# Patient Record
Sex: Female | Born: 1995 | Race: Black or African American | Hispanic: No | Marital: Single | Smoking: Never smoker
Health system: Southern US, Community
[De-identification: ages and names within clinical notes are randomized; demographics above are authoritative.]

## PROBLEM LIST (undated history)

## (undated) DIAGNOSIS — O98819 Other maternal infectious and parasitic diseases complicating pregnancy, unspecified trimester: Secondary | ICD-10-CM

## (undated) DIAGNOSIS — A749 Chlamydial infection, unspecified: Secondary | ICD-10-CM

## (undated) DIAGNOSIS — E119 Type 2 diabetes mellitus without complications: Secondary | ICD-10-CM

---
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
## 2017-08-30 HISTORY — PX: INCISION AND DRAINAGE: SHX5863

## 2017-10-30 ENCOUNTER — Other Ambulatory Visit: Payer: Self-pay

## 2017-10-30 ENCOUNTER — Emergency Department (HOSPITAL_COMMUNITY)
Admission: EM | Admit: 2017-10-30 | Discharge: 2017-10-30 | Disposition: A | Discharge status: Home / Self Care | Payer: PRIVATE HEALTH INSURANCE | Attending: Emergency Medicine | Admitting: Emergency Medicine

## 2017-10-30 ENCOUNTER — Encounter (HOSPITAL_COMMUNITY): Payer: Self-pay

## 2017-10-30 DIAGNOSIS — Z794 Long term (current) use of insulin: Secondary | ICD-10-CM | POA: Insufficient documentation

## 2017-10-30 DIAGNOSIS — L0231 Cutaneous abscess of buttock: Secondary | ICD-10-CM | POA: Insufficient documentation

## 2017-10-30 DIAGNOSIS — E109 Type 1 diabetes mellitus without complications: Secondary | ICD-10-CM | POA: Insufficient documentation

## 2017-10-30 DIAGNOSIS — Z23 Encounter for immunization: Secondary | ICD-10-CM | POA: Diagnosis not present

## 2017-10-30 HISTORY — DX: Type 2 diabetes mellitus without complications: E11.9

## 2017-10-30 LAB — CBG MONITORING, ED: GLUCOSE-CAPILLARY: 328 mg/dL — AB (ref 65–99)

## 2017-10-30 MED ORDER — LIDOCAINE-EPINEPHRINE (PF) 2 %-1:200000 IJ SOLN
10.0000 mL | Freq: Once | INTRAMUSCULAR | Status: AC
  Administered 2017-10-30: 10 mL
  Filled 2017-10-30: qty 20

## 2017-10-30 MED ORDER — TETANUS-DIPHTH-ACELL PERTUSSIS 5-2.5-18.5 LF-MCG/0.5 IM SUSP
0.5000 mL | Freq: Once | INTRAMUSCULAR | Status: AC
  Administered 2017-10-30: 0.5 mL via INTRAMUSCULAR
  Filled 2017-10-30: qty 0.5

## 2017-10-30 MED ORDER — LIDOCAINE-EPINEPHRINE-TETRACAINE (LET) SOLUTION
3.0000 mL | Freq: Once | NASAL | Status: AC
  Administered 2017-10-30: 17:00:00 3 mL via TOPICAL
  Filled 2017-10-30: qty 3

## 2017-10-30 NOTE — ED Triage Notes (Signed)
Pt states she has an abscess on her abscess. She has hx of diabetes and hx of ulcer. Pt states BS has been controlled at home.

## 2017-10-30 NOTE — ED Provider Notes (Signed)
[facility] Provider Note   CSN: [id number] Arrival date & time: 10/30/17  1239     History   Chief Complaint Chief Complaint  Patient presents with  . Abscess    HPI Pamela Horton is a 21 y.o. female with past medical history significant for type 1 diabetes insulin-dependent presenting with right buttocks abscess which has worsened over the past 2 days. Patient has history of same in the past with abscess to buttocks.  She has not tried anything for her symptoms.  Denies any fever, chills, nausea, vomiting.  She reports some mild cold symptoms starting the last few days.  She denies any drainage.  HPI  Past Medical History:  Diagnosis Date  . Diabetes mellitus without complication (HCC)     There are no active problems to display for this patient.     OB History    No data available       Home Medications    Prior to Admission medications   Not on File    Family History No family history on file.  Social History Social History   Tobacco Use  . Smoking status: Never Smoker  . Smokeless tobacco: Never Used  Substance Use Topics  . Alcohol use: No    Frequency: Never  . Drug use: No     Allergies   Patient has no known allergies.   Review of Systems Review of Systems  Constitutional: Negative for chills and fever.  HENT: Positive for sneezing. Negative for congestion, ear pain and sore throat.   Eyes: Negative for pain and visual disturbance.  Respiratory: Positive for cough. Negative for chest tightness, shortness of breath, wheezing and stridor.   Cardiovascular: Negative for chest pain and palpitations.  Gastrointestinal: Positive for nausea. Negative for abdominal distention, abdominal pain, blood in stool, diarrhea and vomiting.  Genitourinary: Negative for difficulty urinating, dysuria and hematuria.  Musculoskeletal: Positive for myalgias. Negative for arthralgias, back pain, neck pain and neck  stiffness.  Skin: Positive for color change. Negative for pallor and rash.  Neurological: Negative for dizziness, seizures, syncope and light-headedness.     Physical Exam Updated Vital Signs BP 110/71   Pulse 99   Temp 98.3 F (36.8 C) (Oral)   Resp 16   LMP 09/29/2017 (Within Days)   SpO2 95%   Physical Exam  Constitutional: She appears well-developed and well-nourished. No distress.  Afebrile, nontoxic appearing, lying comfortably in bed in no acute distress.  HENT:  Head: Normocephalic and atraumatic.  Eyes: Conjunctivae and EOM are normal.  Neck: Normal range of motion.  Cardiovascular: Normal rate, regular rhythm and normal heart sounds.  No murmur heard. Pulmonary/Chest: Effort normal and breath sounds normal. No stridor. No respiratory distress. She has no wheezes. She has no rales.  Abdominal: She exhibits no distension.  Musculoskeletal: Normal range of motion. She exhibits no edema.  Neurological: She is alert.  Skin: Skin is warm and dry. She is not diaphoretic. There is erythema.  1.5 cm area of induration and erythema with central fluctuance to the right buttocks approximately 2 cm from anus  Psychiatric: She has a normal mood and affect.  Nursing note and vitals reviewed.    ED Treatments / Results  Labs (all labs ordered are listed, but only abnormal results are displayed) Labs Reviewed  CBG MONITORING, ED - Abnormal; Notable for the following components:      Result Value   Glucose-Capillary 328 (*)    All other components  Lorin [phone]13.2[facility] [phone] ADTEXTTAG>ry Center LLCKoreaKoreaMadelaine [phone] 8mKentuckyLorin [phone]13.2[facility] HealdtonKoreaKoreaMadelaine [phone] 53mKentuckyLorin [phone]13.2[facility] OcalaKoreaKoreaMadelaine [phone] 78mKentuckyLorin [phone]13.2[facility] CenterKoreaKoreaMadelaine BhatGeorgetown

## 2017-10-30 NOTE — Discharge Instructions (Signed)
As discussed, keep the area clean and dry and I have the wound rechecked in 48 hours.   Packing will likely be removed and you may continue with warm soaks and compress thereafter. Take ibuprofen or tylenol as needed for pain.  Return if symptoms worsen, worsening pain, fever, chills, nausea, vomiting or other new concerning symptoms in the meantime.

## 2017-11-02 ENCOUNTER — Ambulatory Visit (HOSPITAL_COMMUNITY)
Admission: EM | Admit: 2017-11-02 | Discharge: 2017-11-02 | Disposition: A | Discharge status: Home / Self Care | Payer: PRIVATE HEALTH INSURANCE | Attending: Internal Medicine | Admitting: Internal Medicine

## 2017-11-02 ENCOUNTER — Encounter (HOSPITAL_COMMUNITY): Payer: Self-pay | Admitting: Emergency Medicine

## 2017-11-02 ENCOUNTER — Other Ambulatory Visit: Payer: Self-pay

## 2017-11-02 DIAGNOSIS — Z4801 Encounter for change or removal of surgical wound dressing: Secondary | ICD-10-CM | POA: Diagnosis not present

## 2017-11-02 DIAGNOSIS — L0231 Cutaneous abscess of buttock: Secondary | ICD-10-CM

## 2017-11-02 DIAGNOSIS — Z5189 Encounter for other specified aftercare: Secondary | ICD-10-CM

## 2017-11-02 MED ORDER — SULFAMETHOXAZOLE-TRIMETHOPRIM 800-160 MG PO TABS: 1.0000 | ORAL_TABLET | Freq: Two times a day (BID) | ORAL | 0 refills | Status: DC

## 2017-11-02 NOTE — ED Provider Notes (Signed)
[facility]    CSN: [id number] Arrival date & time: 11/02/17  1643     History   Chief Complaint Chief Complaint  Patient presents with  . Wound Check    HPI Pamela Horton is a 21 y.o. female presents to the clinic for evaluation of wound check of the right buttocks.  Patient presented to the ED 10/30/2017 for incision and drainage of right buttocks abscess.  She has had continued drainage and pain.  Swelling has improved.  She denies any fevers.  She is not taking any medications for pain.  Pain is moderate. HPI  Past Medical History:  Diagnosis Date  . Diabetes mellitus without complication (HCC)     There are no active problems to display for this patient.   History reviewed. No pertinent surgical history.  OB History    No data available       Home Medications    Prior to Admission medications   Medication Sig Start Date End Date Taking? Authorizing Provider  sulfamethoxazole-trimethoprim (BACTRIM DS,SEPTRA DS) 800-160 MG tablet Take 1 tablet by mouth 2 (two) times daily. 11/02/17   Evon SlackGaines, Thomas C, PA-C    Family History No family history on file.  Social History Social History   Tobacco Use  . Smoking status: Never Smoker  . Smokeless tobacco: Never Used  Substance Use Topics  . Alcohol use: No    Frequency: Never  . Drug use: No     Allergies   Patient has no known allergies.   Review of Systems Review of Systems  Constitutional: Negative for fever.  Respiratory: Negative for shortness of breath.   Cardiovascular: Negative for chest pain.  Gastrointestinal: Negative for abdominal pain.  Genitourinary: Negative for difficulty urinating, dysuria and urgency.  Musculoskeletal: Negative for back pain and myalgias.  Skin: Positive for wound. Negative for rash.  Neurological: Negative for dizziness and headaches.     Physical Exam Triage Vital Signs ED Triage Vitals  Enc Vitals Group     BP 11/02/17 1719 129/81     Pulse Rate  11/02/17 1719 (!) 125     Resp 11/02/17 1719 18     Temp 11/02/17 1719 98.2 F (36.8 C)     Temp src --      SpO2 11/02/17 1719 98 %     Weight --      Height --      Head Circumference --      Peak Flow --      Pain Score 11/02/17 1720 2     Pain Loc --      Pain Edu? --      Excl. in GC? --    No data found.  Updated Vital Signs BP 129/81   Pulse (!) 125   Temp 98.2 F (36.8 C)   Resp 18   SpO2 98%   Visual Acuity Right Eye Distance:   Left Eye Distance:   Bilateral Distance:    Right Eye Near:   Left Eye Near:    Bilateral Near:     Physical Exam  Constitutional: She is oriented to person, place, and time. She appears well-developed and well-nourished. No distress.  HENT:  Head: Normocephalic and atraumatic.  Mouth/Throat: Oropharynx is clear and moist.  Eyes: Conjunctivae are normal. Right eye exhibits no discharge. Left eye exhibits no discharge.  Neck: Normal range of motion.  Cardiovascular: Normal rate.  Pulmonary/Chest: No respiratory distress.  Musculoskeletal: Normal range of motion. She exhibits no  deformity.  Neurological: She is alert and oriented to person, place, and time. She has normal reflexes.  Skin: Skin is warm and dry.  Examination of the right buttock shows a 0.5 cm in diameter opening along a mildly indurated area that is approximately 1 cm in diameter.  There is mild surrounding erythema that measures 1 cm in diameter.  With compression of the wound there is mild purulent drainage.  There is no fluctuance.  Packing is removed and a smaller piece of packing is placed into the wound.  Patient tolerated the procedure well.  Band-Aid is applied.  Psychiatric: She has a normal mood and affect. Her behavior is normal. Thought content normal.     UC Treatments / Results  Labs (all labs ordered are listed, but only abnormal results are displayed) Labs Reviewed - No data to display  EKG  EKG Interpretation None       Radiology No  results found.  Procedures Procedures (including critical care time)  Medications Ordered in UC Medications - No data to display   Initial Impression / Assessment and Plan / UC Course  I have reviewed the triage vital signs and the nursing notes.  Pertinent labs & imaging results that were available during my care of the patient were reviewed by me and considered in my medical decision making (see chart for details).    21 year old female with purulent abscess.  She continues to have some purulent drainage.  We will place him Bactrim DS 1 tab p.o. twice daily for 7 days.  She will follow-up in 2-3 days for packing removal and wound check.  Patient will most likely not need any additional packing.  Patient is educated on signs and symptoms return to clinic for.  Final Clinical Impressions(s) / UC Diagnoses   Final diagnoses:  Wound check, abscess    ED Discharge Orders        Ordered    sulfamethoxazole-trimethoprim (BACTRIM DS,SEPTRA DS) 800-160 MG tablet  2 times daily     11/02/17 1733        Evon SlackGaines, Thomas C, [place] JerseyPA-C 11/02/17 1741

## 2017-11-02 NOTE — ED Triage Notes (Signed)
Pt was seen on 12/1 for abscess drainage and packing put in, states shes here for wound check.

## 2017-11-02 NOTE — Discharge Instructions (Signed)
Please take antibiotics as prescribed.  Follow-up in 2-3 days for wound check and packing removal.  If packing falls out prior to your follow-up this is okay, please continue with follow-up in 2-3 days for wound recheck.  Take ibuprofen and Tylenol as needed for pain.  Return to the clinic for any fevers increasing pain.

## 2017-11-05 ENCOUNTER — Other Ambulatory Visit: Payer: Self-pay

## 2017-11-05 ENCOUNTER — Ambulatory Visit (HOSPITAL_COMMUNITY)
Admission: EM | Admit: 2017-11-05 | Discharge: 2017-11-05 | Disposition: A | Discharge status: Home / Self Care | Payer: PRIVATE HEALTH INSURANCE | Attending: Internal Medicine | Admitting: Internal Medicine

## 2017-11-05 ENCOUNTER — Encounter (HOSPITAL_COMMUNITY): Payer: Self-pay

## 2017-11-05 DIAGNOSIS — L0231 Cutaneous abscess of buttock: Secondary | ICD-10-CM | POA: Diagnosis not present

## 2017-11-05 DIAGNOSIS — Z5189 Encounter for other specified aftercare: Secondary | ICD-10-CM

## 2017-11-05 DIAGNOSIS — Z4801 Encounter for change or removal of surgical wound dressing: Secondary | ICD-10-CM

## 2017-11-05 MED ORDER — SULFAMETHOXAZOLE-TRIMETHOPRIM 800-160 MG PO TABS: 1.0000 | ORAL_TABLET | Freq: Two times a day (BID) | ORAL | 0 refills | Status: DC

## 2017-11-05 NOTE — ED Notes (Signed)
Pt discharged by provider.

## 2017-11-05 NOTE — ED Triage Notes (Signed)
[facility] pt was seen here on 12/01 for abscess drainage

## 2017-11-05 NOTE — ED Provider Notes (Signed)
[facility]    CSN: [id number] Arrival date & time: 11/05/17  1722     History   Chief Complaint Chief Complaint  Patient presents with  . Abscess    HPI Pamela Horton is a 21 y.o. female.   Pamela Horton presents for follow up for wound packing to left buttock abscess. It was incised and drained 12/1. Repacked on 12/4 and was started on bactrim. Patient has not started taking this. She is a diabetic. Blood sugars have been slightly increased, in the 250-300 range. She states she feels pain has improved, she has taken tylenol a few times which has helped. Without fevers. She is unsure if wound has been draining. Pain is mild in severity.    ROS per HPI.       Past Medical History:  Diagnosis Date  . Diabetes mellitus without complication (HCC)     There are no active problems to display for this patient.   History reviewed. No pertinent surgical history.  OB History    No data available       Home Medications    Prior to Admission medications   Medication Sig Start Date End Date Taking? Authorizing Provider  insulin glargine (LANTUS) 100 UNIT/ML injection Inject into the skin at bedtime.   Yes [provider]  insulin lispro (HUMALOG) 100 UNIT/ML injection Inject into the skin 3 (three) times daily before meals.   Yes [provider]  sulfamethoxazole-trimethoprim (BACTRIM DS,SEPTRA DS) 800-160 MG tablet Take 1 tablet by mouth 2 (two) times daily. 11/05/17   Georgetta HaberBurky, [place]ra [place] B, NP    Family History History reviewed. No pertinent family history.  Social History Social History   Tobacco Use  . Smoking status: Never Smoker  . Smokeless tobacco: Never Used  Substance Use Topics  . Alcohol use: No    Frequency: Never  . Drug use: No     Allergies   Patient has no known allergies.   Review of Systems Review of Systems   Physical Exam Triage Vital Signs ED Triage Vitals [11/05/17 1741]  Enc Vitals Group     BP 123/70   Pulse Rate (!) 102     Resp 17     Temp 98.4 F (36.9 C)     Temp Source Oral     SpO2 98 %     Weight      Height      Head Circumference      Peak Flow      Pain Score      Pain Loc      Pain Edu?      Excl. in GC?    No data found.  Updated Vital Signs BP 123/70 (BP Location: Left Arm)   Pulse (!) 102   Temp 98.4 F (36.9 C) (Oral)   Resp 17   LMP 09/23/2017 (Exact Date)   SpO2 98%   Visual Acuity Right Eye Distance:   Left Eye Distance:   Bilateral Distance:    Right Eye Near:   Left Eye Near:    Bilateral Near:     Physical Exam  Constitutional: She is oriented to person, place, and time. She appears well-developed and well-nourished. No distress.  Cardiovascular: Normal rate, regular rhythm and normal heart sounds.  Pulmonary/Chest: Effort normal and breath sounds normal.  Neurological: She is alert and oriented to person, place, and time.  Skin: Skin is warm and dry.     Approximately 5 mm in diameter open wound  to inner distal left buttock; short packing material removed with light brown drainage noted as well as small bleeding; shallow wound without induration or surrounding tissue firmness or redness     UC Treatments / Results  Labs (all labs ordered are listed, but only abnormal results are displayed) Labs Reviewed - No data to display  EKG  EKG Interpretation None       Radiology No results found.  Procedures Procedures (including critical care time)  Medications Ordered in UC Medications - No data to display   Initial Impression / Assessment and Plan / UC Course  I have reviewed the triage vital signs and the nursing notes.  Pertinent labs & imaging results that were available during my care of the patient were reviewed by me and considered in my medical decision making (see chart for details).     Wound is shallow enough for no further packing at this time, bandage placed. Still with drainage noted, encouraged filling of  antibiotics to take and complete. 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 Encouraged monitoring and management of blood sugars. Return sooner if worsening of symptoms. Patient verbalized understanding and agreeable to plan.    Final Clinical Impressions(s) / UC Diagnoses   Final diagnoses:  Wound check, abscess    ED Discharge Orders        Ordered    sulfamethoxazole-trimethoprim (BACTRIM DS,SEPTRA DS) 800-160 MG tablet  2 times daily     11/05/17 1801       Controlled Substance Prescriptions [place] Controlled Substance Registry consulted? Not Applicable   Georgetta HaberBurky, Chas Axel B, NP 11/05/17 1821

## 2017-11-05 NOTE — Discharge Instructions (Signed)
Clean area with soap and water daily, keep covered.  Please start antibiotics and complete course.  Continue to monitor blood sugars. Wound recheck in approximately 3-4 days. Return sooner if increase in symptoms.

## 2017-11-30 DIAGNOSIS — A749 Chlamydial infection, unspecified: Secondary | ICD-10-CM

## 2017-11-30 HISTORY — DX: Chlamydial infection, unspecified: A74.9

## 2017-12-14 ENCOUNTER — Ambulatory Visit (INDEPENDENT_AMBULATORY_CARE_PROVIDER_SITE_OTHER): Payer: PRIVATE HEALTH INSURANCE | Admitting: Obstetrics

## 2017-12-14 ENCOUNTER — Other Ambulatory Visit: Payer: PRIVATE HEALTH INSURANCE

## 2017-12-14 ENCOUNTER — Encounter: Payer: Self-pay | Admitting: Obstetrics

## 2017-12-14 VITALS — BP 124/77 | HR 101 | Ht 60.0 in | Wt 183.2 lb

## 2017-12-14 DIAGNOSIS — Z113 Encounter for screening for infections with a predominantly sexual mode of transmission: Secondary | ICD-10-CM

## 2017-12-14 DIAGNOSIS — E1065 Type 1 diabetes mellitus with hyperglycemia: Secondary | ICD-10-CM | POA: Insufficient documentation

## 2017-12-14 DIAGNOSIS — O0991 Supervision of high risk pregnancy, unspecified, first trimester: Secondary | ICD-10-CM | POA: Diagnosis not present

## 2017-12-14 DIAGNOSIS — O0990 Supervision of high risk pregnancy, unspecified, unspecified trimester: Secondary | ICD-10-CM | POA: Insufficient documentation

## 2017-12-14 DIAGNOSIS — E109 Type 1 diabetes mellitus without complications: Secondary | ICD-10-CM

## 2017-12-14 DIAGNOSIS — O3680X0 Pregnancy with inconclusive fetal viability, not applicable or unspecified: Secondary | ICD-10-CM | POA: Diagnosis not present

## 2017-12-14 DIAGNOSIS — IMO0002 Reserved for concepts with insufficient information to code with codable children: Secondary | ICD-10-CM | POA: Insufficient documentation

## 2017-12-14 MED ORDER — OB COMPLETE PETITE 35-5-1-200 MG PO CAPS: 1.0000 | ORAL_CAPSULE | Freq: Every day | ORAL | 12 refills | Status: DC

## 2017-12-14 MED ORDER — GLUCOSE BLOOD VI STRP: ORAL_STRIP | 12 refills | Status: AC

## 2017-12-14 MED ORDER — ACCU-CHEK FASTCLIX LANCETS MISC: 1.0000 "application " | Freq: Four times a day (QID) | 12 refills | Status: AC

## 2017-12-14 NOTE — Progress Notes (Signed)
Pt is here for a new OB. This is not a planned pregnancy. Pt does not live with significant other.

## 2017-12-14 NOTE — Progress Notes (Signed)
Subjective:    Pamela Horton is being seen today for her first obstetrical visit.  This is not a planned pregnancy. She is at 6159w6d gestation. Her obstetrical history is significant for none. Relationship with FOB: significant other, not living together. Patient does intend to breast feed. Pregnancy history fully reviewed.  The information documented in the HPI was reviewed and verified.  Menstrual History: OB History    Gravida Para Term Preterm AB Living   1             SAB TAB Ectopic Multiple Live Births                   Patient's last menstrual period was 09/23/2017.    Past Medical History:  Diagnosis Date  . Diabetes mellitus without complication [facility](HCC)     Past Surgical History:  Procedure Laterality Date  . INCISION AND DRAINAGE  08/2017  . WISDOM TOOTH EXTRACTION  02/21/2014     (Not in a hospital admission) No Known Allergies  Social History   Tobacco Use  . Smoking status: Never Smoker  . Smokeless tobacco: Never Used  Substance Use Topics  . Alcohol use: No    Frequency: Never    Family History  Problem Relation Age of Onset  . Hypertension Father   . Hypertension Paternal Grandmother   . Diabetes Paternal Grandfather   . Hypertension Paternal Grandfather   . High Cholesterol Paternal Grandfather      Review of Systems Constitutional: negative for weight loss Gastrointestinal: negative for vomiting Genitourinary:negative for genital lesions and vaginal discharge and dysuria Musculoskeletal:negative for back pain Behavioral/Psych: negative for abusive relationship, depression, illegal drug usage and tobacco use    Objective:    BP 124/77   Pulse (!) 101   Ht 5' (1.524 m)   Wt 183 lb 3.2 oz (83.1 kg)   LMP 09/23/2017   BMI 35.78 kg/m  General Appearance:    Alert, cooperative, no distress, appears stated age  Head:    Normocephalic, without obvious abnormality, atraumatic  Eyes:    PERRL, conjunctiva/corneas clear, EOM's intact, fundi   benign, both eyes  Ears:    Normal TM's and external ear canals, both ears  Nose:   Nares normal, septum midline, mucosa normal, no drainage    or sinus tenderness  Throat:   Lips, mucosa, and tongue normal; teeth and gums normal  Neck:   Supple, symmetrical, trachea midline, no adenopathy;    thyroid:  no enlargement/tenderness/nodules; no carotid   bruit or JVD  Back:     Symmetric, no curvature, ROM normal, no CVA tenderness  Lungs:     Clear to auscultation bilaterally, respirations unlabored  Chest Wall:    No tenderness or deformity   Heart:    Regular rate and rhythm, S1 and S2 normal, no murmur, rub   or gallop  Breast Exam:    No tenderness, masses, or nipple abnormality  Abdomen:     Soft, non-tender, bowel sounds active all four quadrants,    no masses, no organomegaly  Genitalia:    Normal female without lesion, discharge or tenderness  Extremities:   Extremities normal, atraumatic, no cyanosis or edema  Pulses:   2+ and symmetric all extremities  Skin:   Skin color, texture, turgor normal, no rashes or lesions  Lymph nodes:   Cervical, supraclavicular, and axillary nodes normal  Neurologic:   CNII-XII intact, normal strength, sensation and reflexes    throughout  Lab Review Urine pregnancy test Labs reviewed yes Radiologic studies reviewed yes  Informal Limited OB Ultrasound:  9 weeks 6 days viable IUP with FHR 170 bpm.  EDC 8 / 14 / 2019  Assessment:    Pregnancy at [redacted]w[redacted]d weeks    Type 1 IDDM   Plan:     1. Supervision of high risk pregnancy, antepartum Rx: - Culture, OB Urine - Hemoglobinopathy evaluation - Obstetric Panel, Including HIV - Genetic Screening - Cervicovaginal ancillary only - AMB referral to maternal fetal medicine - [place] MFM OB Comp Less 14 Wks; Future - [place] MFM OB Transvaginal; Future  2. Encounter to determine fetal viability of pregnancy, single or unspecified fetus Rx: - US OB Limited  3. Controlled diabetes mellitus type 1  without complications (HCC) Rx: - Amb Referral to Nutrition and Diabetic E - glucose blood test strip; Use as instructed  Dispense: 100 each; Refill: 12 - ACCU-CHEK FASTCLIX LANCETS MISC; 1 application by Percutaneous route 4 (four) times daily.  Dispense: 100 each; Refill: 12 - Hemoglobin A1c - Ambulatory referral to Endocrinology - Comprehensive metabolic panel - Protein / creatinine ratio, urine - Ambulatory referral to Ophthalmology - ECHO FETAL; Future  4. Supervision of high risk pregnancy in first trimester Rx: - VITAMIN D 25 Hydroxy (Vit-D Deficiency, Fractures) - Inheritest Core(CF97,SMA,FraX) - Prenat-FeCbn-FeAspGl-FA-Omega (OB COMPLETE PETITE) 35-5-1-200 MG CAPS; Take 1 tablet by mouth daily.  Dispense: 30 capsule; Refill: 12  Prenatal vitamins.  Counseling provided regarding continued use of seat belts, cessation of alcohol consumption, smoking or use of illicit drugs; infection precautions i.e., influenza/TDAP immunizations, toxoplasmosis,CMV, parvovirus, listeria and varicella; workplace safety, exercise during pregnancy; routine dental care, safe medications, sexual activity, hot tubs, saunas, pools, travel, caffeine use, fish and methlymercury, potential toxins, hair treatments, varicose veins Weight gain recommendations per IOM guidelines reviewed: underweight/BMI< 18.5--> gain 28 - 40 lbs; normal weight/BMI 18.5 - 24.9--> gain 25 - 35 lbs; overweight/BMI 25 - 29.9--> gain 15 - 25 lbs; obese/BMI >30->gain  11 - 20 lbs Problem list reviewed and updated. FIRST/CF mutation testing/NIPT/QUAD SCREEN/fragile X/Ashkenazi Jewish population testing/Spinal muscular atrophy discussed: requested. Role of ultrasound in pregnancy discussed; fetal survey: requested. Amniocentesis discussed: not indicated.  Meds ordered this encounter  Medications  . glucose blood test strip    Sig: Use as instructed    Dispense:  100 each    Refill:  12  . ACCU-CHEK FASTCLIX LANCETS MISC    Sig: 1  application by Percutaneous route 4 (four) times daily.    Dispense:  100 each    Refill:  12  . Prenat-FeCbn-FeAspGl-FA-Omega (OB COMPLETE PETITE) 35-5-1-200 MG CAPS    Sig: Take 1 tablet by mouth daily.    Dispense:  30 capsule    Refill:  12   Orders Placed This Encounter  Procedures  . Culture, OB Urine  . US OB Limited    Order Specific Question:   Reason for Exam (SYMPTOM  OR DIAGNOSIS REQUIRED)    Answer:   viability    Order Specific Question:   Preferred Imaging Location?    Answer:   Internal  . [place] MFM OB Comp Less 14 Wks    Standing Status:   Future    Standing Expiration Date:   02/12/2019    Order Specific Question:   Reason for Exam (SYMPTOM  OR DIAGNOSIS REQUIRED)    Answer:   Type 1 IDDM    Order Specific Question:   Preferred Imaging Location?    Answer:  [facility]ound  . [place] MFM OB Transvaginal    Standing Status:   Future    Standing Expiration Date:   02/12/2019    Order Specific Question:   Reason for Exam (SYMPTOM  OR DIAGNOSIS REQUIRED)    Answer:   Type 1 IDDM    Order Specific Question:   Preferred Imaging Location?    Answer:   [facility]ound  . Hemoglobinopathy evaluation  . Obstetric Panel, Including HIV  . Genetic Screening  . Hemoglobin A1c  . Comprehensive metabolic panel  . Protein / creatinine ratio, urine  . VITAMIN D 25 Hydroxy (Vit-D Deficiency, Fractures)  . Inheritest Core(CF97,SMA,FraX)    Order Specific Question:   Is patient insulin dependent?    Answer:   Yes    Order Specific Question:   Weight (lbs)    Answer:   27    Order Specific Question:   Gestational Age (GA), weeks    Answer:   9.6    Order Specific Question:   Date on which patient was at this GA    Answer:   12/14/2017    Order Specific Question:   GA Calculation Method    Answer:   Ultrasound    Order Specific Question:   GA Date    Answer:   07/13/2018    Order Specific Question:   Number of fetuses    Answer:   1    Order Specific Question:   Donor egg?     Answer:   N    Order Specific Question:   Age of egg donor?    Answer:   21  . AMB referral to maternal fetal medicine    Referral Priority:   Routine    Referral Type:   Consultation    Referral Reason:   Specialty Services Required    Number of Visits Requested:   1  . Amb Referral to Nutrition and Diabetic E    Referral Priority:   Routine    Referral Type:   Consultation    Referral Reason:   Specialty Services Required    Number of Visits Requested:   1  . Ambulatory referral to Endocrinology    Referral Priority:   Urgent    Referral Type:   Consultation    Referral Reason:   Specialty Services Required    Number of Visits Requested:   1  . Ambulatory referral to Ophthalmology    Referral Priority:   Routine    Referral Type:   Consultation    Referral Reason:   Specialty Services Required    Requested Specialty:   Ophthalmology    Number of Visits Requested:   1  . ECHO FETAL    Standing Status:   Future    Standing Expiration Date:   03/15/2019    Follow up in 4 weeks. 50% of 25 min visit spent on counseling and coordination of care.    Brock Bad MD

## 2017-12-15 LAB — PROTEIN / CREATININE RATIO, URINE
Creatinine, Urine: 192.9 mg/dL
PROTEIN/CREAT RATIO: 152 mg/g{creat} (ref 0–200)
Protein, Ur: 29.4 mg/dL

## 2017-12-15 LAB — CERVICOVAGINAL ANCILLARY ONLY
Bacterial vaginitis: NEGATIVE
CANDIDA VAGINITIS: POSITIVE — AB
Chlamydia: POSITIVE — AB
Neisseria Gonorrhea: NEGATIVE
TRICH (WINDOWPATH): NEGATIVE

## 2017-12-16 ENCOUNTER — Telehealth: Payer: Self-pay

## 2017-12-16 ENCOUNTER — Encounter (HOSPITAL_COMMUNITY): Payer: Self-pay

## 2017-12-16 ENCOUNTER — Ambulatory Visit (HOSPITAL_COMMUNITY)
Admission: RE | Admit: 2017-12-16 | Discharge: 2017-12-16 | Disposition: A | Discharge status: Home / Self Care | Payer: PRIVATE HEALTH INSURANCE | Source: Ambulatory Visit | Attending: Obstetrics | Admitting: Obstetrics

## 2017-12-16 ENCOUNTER — Other Ambulatory Visit: Payer: Self-pay | Admitting: Obstetrics

## 2017-12-16 DIAGNOSIS — O0990 Supervision of high risk pregnancy, unspecified, unspecified trimester: Secondary | ICD-10-CM | POA: Diagnosis not present

## 2017-12-16 DIAGNOSIS — O24019 Pre-existing diabetes mellitus, type 1, in pregnancy, unspecified trimester: Secondary | ICD-10-CM | POA: Insufficient documentation

## 2017-12-16 DIAGNOSIS — A749 Chlamydial infection, unspecified: Secondary | ICD-10-CM

## 2017-12-16 DIAGNOSIS — Z3A10 10 weeks gestation of pregnancy: Secondary | ICD-10-CM | POA: Insufficient documentation

## 2017-12-16 DIAGNOSIS — E109 Type 1 diabetes mellitus without complications: Secondary | ICD-10-CM

## 2017-12-16 DIAGNOSIS — B373 Candidiasis of vulva and vagina: Secondary | ICD-10-CM

## 2017-12-16 DIAGNOSIS — B3731 Acute candidiasis of vulva and vagina: Secondary | ICD-10-CM

## 2017-12-16 DIAGNOSIS — O98819 Other maternal infectious and parasitic diseases complicating pregnancy, unspecified trimester: Secondary | ICD-10-CM

## 2017-12-16 DIAGNOSIS — O24011 Pre-existing diabetes mellitus, type 1, in pregnancy, first trimester: Secondary | ICD-10-CM | POA: Diagnosis present

## 2017-12-16 HISTORY — DX: Chlamydial infection, unspecified: A74.9

## 2017-12-16 HISTORY — DX: Other maternal infectious and parasitic diseases complicating pregnancy, unspecified trimester: O98.819

## 2017-12-16 LAB — CULTURE, OB URINE

## 2017-12-16 LAB — URINE CULTURE, OB REFLEX

## 2017-12-16 MED ORDER — AZITHROMYCIN 500 MG PO TABS: 1000.0000 mg | ORAL_TABLET | Freq: Once | ORAL | 0 refills | Status: AC

## 2017-12-16 MED ORDER — TERCONAZOLE 0.4 % VA CREA: 1.0000 | TOPICAL_CREAM | Freq: Every day | VAGINAL | 0 refills | Status: DC

## 2017-12-16 MED ORDER — CEFIXIME 400 MG PO CAPS: 400.0000 mg | ORAL_CAPSULE | Freq: Once | ORAL | 0 refills | Status: AC

## 2017-12-16 NOTE — Telephone Encounter (Signed)
Patient notified and [facility] notified.

## 2017-12-16 NOTE — Telephone Encounter (Signed)
-----   Message from Brock Badharles A Harper, MD sent at 12/16/2017  5:19 AM EST ----- Azithromycin / Suprax Rx for Chlamydia

## 2017-12-16 NOTE — Consult Note (Signed)
Maternal Fetal Medicine Consultation  Requesting Provider(s): [facility]  Primary OB: Harper Reason for consultation: Type 1 diabetes in poor control  HPI: 21yo P0 at 10+1 weeks with a 5 year history of type 1 diabetes, diagnosed at age 22 with DKA episode. Since that time she has been under the care of Dr. Lynnell CatalanKelly Keller at [facility]. Her home [place] in [place], [place] but attends [employer] and plans to [place] in [place]. Her current diabetic regimen consists of checking her blood sugars fasting and AC. She does not check postprandial blood sugars. She is taking 55 units of Lantus at 7pm. She take Humalog prior to meals 1 unit per 6 grams carbohydrates, and has a rescue regimen of Humalog 1 unit per 20 mg/dl > 161150 mg/dl. She is unfortunately in poor control. Her most recent HgbA1C was 14% in mid-December.  She states that since the diagnosis of pregnancy she has been much more regimented about her diet and her blood glucose monitoring. She exercises at a gym at least twice a week and strives for three times a week. There is a HgbA1C from Dr. Verdell CarmineHarper's office that is pending. She has an appointment for diabetic and nutritional counseling on 12/22/17 and will be seeing a local endocrinologist, Dr. Romero BellingSean Ellison on 01/13/18. She has an ophthalmologic exam scheduled "sometime in March". She has had one episode of DKA since her diagnosis episode; this occurred in October of 2017 OB History: OB History    Gravida Para Term Preterm AB Living   1         0   SAB TAB Ectopic Multiple Live Births                  PMH:  Past Medical History:  Diagnosis Date  . Chlamydia infection affecting pregnancy 11/2017  . Diabetes mellitus without complication (HCC)     PSH:  Past Surgical History:  Procedure Laterality Date  . INCISION AND DRAINAGE  08/2017  . WISDOM TOOTH EXTRACTION  02/21/2014   Meds: Lantus, Humalog, PNV Allergies: NKDA FH: See EPIC section Soc: Denies tobacco, alcohol or  illicit drug use  Review of Systems: no vaginal bleeding or cramping/contractions, no LOF, no nausea/vomiting. All other systems reviewed and are negative.  PE:  VS: BP 120/72   Pulse 88    Weight 184.8 pounds GEN: well-appearing female ABD: gravid, NT  Please see separate document for fetal ultrasound report.  A/P: Type 1 diabetes in pregnancy, in poor control At this time no changes are made in her glucose regimen pending the new HgbA1C and the input of Dr. Everardo AllEllison. I encouraged her to add postprandial blood sugars after every meal, and use her rescue regimen for elevated blood sugars. I let her know our goal for good control was a HgbA1C <6%, fastings <90-95, and 2 hour postprandials <120. We discussed the difficulty involved with good control in pregnancy for a patient with "brittle" type 1 diabetes and the need for consistency in intake, exercise and vigilance in medication and monitoring regimens. We then discussed the risks involved in type 1 diabetes in pregnancy. We discussed the increased risk for fetal structural anomalies, in particular cardiac anomalies. I estimate her risk to be c. 5-6 percent based on her recent HgbA1C. We will schedule an 18 week anatomic survey and get her set up for fetal echocardiogram c. 22 weeks. She will need evaluations every 4 weeks by ultrasound for growth after her anatomic survey. She should start BPPs  at 30 weeks and continue testing through delivery. A full set of preeclamptic labs including a 24 hour urine for protein and creatinine should be done ASAP. I have asked her to begin an 81mg  aspirin daily  Thank you for the opportunity to be a part of the care of [employer]. Please contact our office if we can be of further assistance.   I spent approximately 30 minutes with this patient with over 50% of time spent in face-to-face counseling.

## 2017-12-16 NOTE — Telephone Encounter (Signed)
Patient notified

## 2017-12-17 ENCOUNTER — Other Ambulatory Visit (HOSPITAL_COMMUNITY): Payer: Self-pay | Admitting: *Deleted

## 2017-12-17 DIAGNOSIS — O24312 Unspecified pre-existing diabetes mellitus in pregnancy, second trimester: Principal | ICD-10-CM

## 2017-12-17 DIAGNOSIS — Z794 Long term (current) use of insulin: Principal | ICD-10-CM

## 2017-12-17 DIAGNOSIS — IMO0001 Reserved for inherently not codable concepts without codable children: Secondary | ICD-10-CM

## 2017-12-18 ENCOUNTER — Other Ambulatory Visit: Payer: Self-pay | Admitting: Obstetrics

## 2017-12-18 DIAGNOSIS — E559 Vitamin D deficiency, unspecified: Secondary | ICD-10-CM

## 2017-12-18 LAB — COMPREHENSIVE METABOLIC PANEL
A/G RATIO: 1.5 (ref 1.2–2.2)
ALK PHOS: 108 IU/L (ref 39–117)
ALT: 14 IU/L (ref 0–32)
AST: 17 IU/L (ref 0–40)
Albumin: 4.1 g/dL (ref 3.5–5.5)
BILIRUBIN TOTAL: 0.4 mg/dL (ref 0.0–1.2)
BUN / CREAT RATIO: 12 (ref 9–23)
BUN: 7 mg/dL (ref 6–20)
CHLORIDE: 99 mmol/L (ref 96–106)
CO2: 25 mmol/L (ref 20–29)
Calcium: 9.7 mg/dL (ref 8.7–10.2)
Creatinine, Ser: 0.59 mg/dL (ref 0.57–1.00)
GFR calc non Af Amer: 133 mL/min/{1.73_m2} (ref >59)
GFR, EST AFRICAN AMERICAN: 154 mL/min/{1.73_m2} (ref >59)
GLOBULIN, TOTAL: 2.8 g/dL (ref 1.5–4.5)
Glucose: 232 mg/dL — ABNORMAL HIGH (ref 65–99)
Potassium: 5 mmol/L (ref 3.5–5.2)
SODIUM: 136 mmol/L (ref 134–144)
TOTAL PROTEIN: 6.9 g/dL (ref 6.0–8.5)

## 2017-12-18 LAB — OBSTETRIC PANEL, INCLUDING HIV
Antibody Screen: NEGATIVE
Basophils Absolute: 0 10*3/uL (ref 0.0–0.2)
Basos: 0 %
EOS (ABSOLUTE): 0.2 10*3/uL (ref 0.0–0.4)
EOS: 2 %
HEMOGLOBIN: 13.4 g/dL (ref 11.1–15.9)
HEP B S AG: NEGATIVE
HIV SCREEN 4TH GENERATION: NONREACTIVE
Hematocrit: 40.8 % (ref 34.0–46.6)
Immature Grans (Abs): 0 10*3/uL (ref 0.0–0.1)
Immature Granulocytes: 0 %
LYMPHS ABS: 1.7 10*3/uL (ref 0.7–3.1)
Lymphs: 15 %
MCH: 27.4 pg (ref 26.6–33.0)
MCHC: 32.8 g/dL (ref 31.5–35.7)
MCV: 83 fL (ref 79–97)
Monocytes Absolute: 0.7 10*3/uL (ref 0.1–0.9)
Monocytes: 6 %
NEUTROS ABS: 9.1 10*3/uL — AB (ref 1.4–7.0)
NEUTROS PCT: 77 %
PLATELETS: 588 10*3/uL — AB (ref 150–379)
RBC: 4.89 x10E6/uL (ref 3.77–5.28)
RDW: 14.4 % (ref 12.3–15.4)
RH TYPE: POSITIVE
RPR: NONREACTIVE
Rubella Antibodies, IGG: 3.32 index (ref >0.99)
WBC: 11.8 10*3/uL — AB (ref 3.4–10.8)

## 2017-12-18 LAB — HEMOGLOBINOPATHY EVALUATION
HEMOGLOBIN A2 QUANTITATION: 2.4 % (ref 1.8–3.2)
HEMOGLOBIN F QUANTITATION: 0 % (ref 0.0–2.0)
HGB A: 97.6 % (ref 96.4–98.8)
HGB C: 0 %
HGB S: 0 %
HGB VARIANT: 0 %

## 2017-12-18 LAB — HEMOGLOBIN A1C
Est. average glucose Bld gHb Est-mCnc: 329 mg/dL
Hgb A1c MFr Bld: 13.1 % — ABNORMAL HIGH (ref 4.8–5.6)

## 2017-12-18 LAB — VITAMIN D 25 HYDROXY (VIT D DEFICIENCY, FRACTURES): Vit D, 25-Hydroxy: 16.5 ng/mL — ABNORMAL LOW (ref 30.0–100.0)

## 2017-12-18 MED ORDER — VITAMIN D 50 MCG (2000 UT) PO CAPS: 1.0000 | ORAL_CAPSULE | Freq: Every day | ORAL | 11 refills | Status: AC

## 2017-12-22 ENCOUNTER — Ambulatory Visit: Payer: PRIVATE HEALTH INSURANCE | Admitting: Registered"

## 2017-12-22 LAB — INHERITEST CORE(CF97,SMA,FRAX)

## 2017-12-24 ENCOUNTER — Encounter: Payer: Self-pay | Admitting: *Deleted

## 2017-12-28 ENCOUNTER — Telehealth: Payer: Self-pay | Admitting: Pediatrics

## 2017-12-28 DIAGNOSIS — O0991 Supervision of high risk pregnancy, unspecified, first trimester: Secondary | ICD-10-CM

## 2017-12-28 NOTE — Telephone Encounter (Signed)
Pt called nurse line in regards to [facility]or[facility] results.    [facility], patient requires a redraw d/t GA of 2230w6d @ time of original draw.  I advised patient of issue. She states she will call me back today or tomorrow and let me know when to add her to the lab schedule.  Order entered.

## 2018-01-05 ENCOUNTER — Ambulatory Visit: Payer: PRIVATE HEALTH INSURANCE

## 2018-01-05 ENCOUNTER — Encounter: Payer: PRIVATE HEALTH INSURANCE | Attending: Obstetrics | Admitting: *Deleted

## 2018-01-05 DIAGNOSIS — E109 Type 1 diabetes mellitus without complications: Secondary | ICD-10-CM | POA: Insufficient documentation

## 2018-01-05 DIAGNOSIS — O24019 Pre-existing diabetes mellitus, type 1, in pregnancy, unspecified trimester: Secondary | ICD-10-CM

## 2018-01-05 DIAGNOSIS — Z713 Dietary counseling and surveillance: Secondary | ICD-10-CM | POA: Insufficient documentation

## 2018-01-05 NOTE — Progress Notes (Signed)
  Patient was seen on 01/05/2018 for Type 1 Diabetes and pregnancy self-management . Patient is in college here in [place], but her home is in [place]. She is here with her roommate from school. EDD 07/13/2018. 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 She states she is familiar with the physiology of diabetes and is testing before each meal so she can determine if she needs a correction dose of insulin or not. Diet history obtained, she is limiting carb intake to mostly salads, she does not drink any sweetened beverages. She states her comfort level with carb counting is 6/10. She states she has been on Animas pump before but has been off of it for about 2 years, since they went out of business. The following learning objectives were met by the patient :   States the definition of Type 1 Diabetes and pregnancy    States why dietary management is important in controlling blood glucose  Describes the effects of carbohydrates on blood glucose levels  Demonstrates ability to create a balanced meal plan  Demonstrates carbohydrate counting   States when to check blood glucose levels  Demonstrates proper blood glucose monitoring techniques  States the effect of stress and exercise on blood glucose levels  States the importance of limiting caffeine and abstaining from alcohol and smoking  Plan:  Aim for 3 Carb Choices per meal (45 grams) +/- 1 either way  Aim for 1-2 Carbs per snack Begin reading food labels for Total Carbohydrate of foods Continue with your activity level by walking or other activity daily as tolerated Begin checking BG before breakfast and 2 hours after each meal as directed by MD  Bring Log Book to every medical appointment   Take medication as directed by MD  Patient instructed to monitor glucose levels: FBS: 60 - 95 mg/dl 2 hour: <120 mg/dl  Patient received the following handouts:  Nutrition Diabetes and Pregnancy  Carbohydrate Counting  List  Insulin Action handout  Insulin Pump and CGM comparison handout  Type 1 Support Group flyer  Patient will be seen for follow-up as needed.

## 2018-01-11 ENCOUNTER — Other Ambulatory Visit: Payer: Self-pay

## 2018-01-11 ENCOUNTER — Ambulatory Visit (INDEPENDENT_AMBULATORY_CARE_PROVIDER_SITE_OTHER): Payer: PRIVATE HEALTH INSURANCE | Admitting: Obstetrics and Gynecology

## 2018-01-11 ENCOUNTER — Encounter: Payer: Self-pay | Admitting: Obstetrics and Gynecology

## 2018-01-11 VITALS — BP 102/72 | HR 105 | Wt 182.0 lb

## 2018-01-11 DIAGNOSIS — O0990 Supervision of high risk pregnancy, unspecified, unspecified trimester: Secondary | ICD-10-CM | POA: Diagnosis not present

## 2018-01-11 DIAGNOSIS — O24019 Pre-existing diabetes mellitus, type 1, in pregnancy, unspecified trimester: Secondary | ICD-10-CM

## 2018-01-11 MED ORDER — ASPIRIN EC 81 MG PO TBEC: 81.0000 mg | DELAYED_RELEASE_TABLET | Freq: Every day | ORAL | 2 refills | Status: AC

## 2018-01-11 NOTE — Progress Notes (Signed)
Complains of rashes on stretch marks that itches frequently.

## 2018-01-11 NOTE — Progress Notes (Signed)
Subjective:  Pamela Horton is a 22 y.o. G1P0 at 5341w6d being seen today for ongoing prenatal care.  She is currently monitored for the following issues for this high-risk pregnancy and has Supervision of high risk pregnancy, antepartum; Supervision of high risk pregnancy in first trimester; Controlled diabetes mellitus type 1 without complications (HCC); and Pre-existing type 1 diabetes affecting pregnancy, antepartum on their problem list.  Patient reports no complaints.  Contractions: Not present. Vag. Bleeding: None.   . Denies leaking of fluid.   The following portions of the patient's history were reviewed and updated as appropriate: allergies, current medications, past family history, past medical history, past social history, past surgical history and problem list. Problem list updated.  Objective:   Vitals:   01/11/18 1426  BP: 102/72  Pulse: (!) 105  Weight: 182 lb (82.6 kg)    Fetal Status:           General:  Alert, oriented and cooperative. Patient is in no acute distress.  Skin: Skin is warm and dry. No rash noted.   Cardiovascular: Normal heart rate noted  Respiratory: Normal respiratory effort, no problems with respiration noted  Abdomen: Soft, gravid, appropriate for gestational age. Pain/Pressure: Present     Pelvic:  Cervical exam deferred        Extremities: Normal range of motion.  Edema: None  Mental Status: Normal mood and affect. Normal behavior. Normal judgment and thought content.   Urinalysis:      Assessment and Plan:  Pregnancy: G1P0 at 341w6d  1. Supervision of high risk pregnancy, antepartum Stable  2. Pre-existing type 1 diabetes affecting pregnancy, antepartum Has appt with Endocrine this Thursday CBG's still not in goal range. Has seen MFM for discussion of DM and pregnancy Referrals for fetal ECHO and Optho have been made - aspirin EC 81 MG tablet; Take 1 tablet (81 mg total) by mouth daily. Take after 12 weeks for prevention of preeclampsia  later in pregnancy  Dispense: 300 tablet; Refill: 2  Preterm labor symptoms and general obstetric precautions including but not limited to vaginal bleeding, contractions, leaking of fluid and fetal movement were reviewed in detail with the patient. Please refer to After Visit Summary for other counseling recommendations.  Return in about 4 weeks (around 02/08/2018) for OB visit.   Hermina StaggersErvin, Lorren Splawn L, MD

## 2018-01-11 NOTE — Addendum Note (Signed)
Addended by: Marinell BlightMASSEY, Peityn Payton L on: 01/11/2018 03:39 PM   Modules accepted: Orders

## 2018-01-11 NOTE — Patient Instructions (Signed)

## 2018-01-13 ENCOUNTER — Ambulatory Visit (INDEPENDENT_AMBULATORY_CARE_PROVIDER_SITE_OTHER): Payer: PRIVATE HEALTH INSURANCE | Admitting: Endocrinology

## 2018-01-13 ENCOUNTER — Encounter: Payer: Self-pay | Admitting: Endocrinology

## 2018-01-13 VITALS — BP 112/80 | HR 86 | Wt 190.2 lb

## 2018-01-13 DIAGNOSIS — E109 Type 1 diabetes mellitus without complications: Secondary | ICD-10-CM

## 2018-01-13 MED ORDER — DEXCOM G5 MOBILE TRANSMITTER MISC: 1.0000 | Freq: Once | 0 refills | Status: AC

## 2018-01-13 MED ORDER — INSULIN LISPRO 100 UNIT/ML (KWIKPEN): 2.0000 [IU] | PEN_INJECTOR | Freq: Three times a day (TID) | SUBCUTANEOUS | 11 refills | Status: AC

## 2018-01-13 MED ORDER — INSULIN GLARGINE 100 UNIT/ML SOLOSTAR PEN: 50.0000 [IU] | PEN_INJECTOR | SUBCUTANEOUS | 99 refills | Status: AC

## 2018-01-13 NOTE — Progress Notes (Signed)
Subjective:    Patient ID: Pamela Horton, female    DOB: 26-Apr-1996, 22 y.o.   MRN: [id number]  HPI pt is referred for diabetes.  Pt states DM was dx'ed in 2013; she has mild if any neuropathy of the lower extremities; she is unaware of any associated chronic complications; she has been on insulin since dx; pt says her diet and exercise are good; she has never had pancreatitis, pancreatic surgery, or severe hypoglycemia.  Last episode of DKA was in 2016.  She is now [redacted] weeks pregnant. She takes lantus 38 units qhs, and humalog 1-6 units 3 times a day (just before each meal).  She says cbg's vary from 150-200's.  She says she never misses the insulin shots.  [place]  She has a Dexcom G-5 (but not using now).   Past Medical History:  Diagnosis Date  . Chlamydia infection affecting pregnancy 11/2017  . Diabetes mellitus without complication [facility])     Past Surgical History:  Procedure Laterality Date  . INCISION AND DRAINAGE  08/2017  . WISDOM TOOTH EXTRACTION  02/21/2014    Social History   Socioeconomic History  . Marital status: Single    Spouse name: Not on file  . Number of children: Not on file  . Years of education: Not on file  . Highest education level: Not on file  Social Needs  . Financial resource strain: Not on file  . Food insecurity - worry: Not on file  . Food insecurity - inability: Not on file  . Transportation needs - medical: Not on file  . Transportation needs - non-medical: Not on file  Occupational History  . Not on file  Tobacco Use  . Smoking status: Never Smoker  . Smokeless tobacco: Never Used  Substance and Sexual Activity  . Alcohol use: No    Frequency: Never  . Drug use: No  . Sexual activity: Yes    Partners: Male    Birth control/protection: None  Other Topics Concern  . Not on file  Social History Narrative  . Not on file    Current Outpatient Medications on File Prior to Visit  Medication Sig Dispense  Refill  . ACCU-CHEK FASTCLIX LANCETS MISC 1 application by Percutaneous route 4 (four) times daily. 100 each 12  . aspirin EC 81 MG tablet Take 1 tablet (81 mg total) by mouth daily. Take after 12 weeks for prevention of preeclampsia later in pregnancy 300 tablet 2  . Cholecalciferol (VITAMIN D) 2000 units CAPS Take 1 capsule (2,000 Units total) by mouth daily before breakfast. 30 capsule 11  . glucose blood test strip Use as instructed 100 each 12  . insulin glargine (LANTUS) 100 UNIT/ML injection Inject 38 Units into the skin at bedtime.     . Prenatal Vit-Fe Fumarate-FA (MULTIVITAMIN-PRENATAL) 27-0.8 MG TABS tablet Take 1 tablet by mouth daily at 12 noon.     No current facility-administered medications on file prior to visit.     No Known Allergies  Family History  Problem Relation Age of Onset  . Hypertension Father   . Hypertension Paternal Grandmother   . Diabetes Paternal Grandfather   . Hypertension Paternal Grandfather   . High Cholesterol Paternal Grandfather     BP 112/80 (BP Location: Left Arm, Patient Position: Sitting, Cuff Size: Normal)   Pulse 86   Wt 190 lb 3.2 oz (86.3 kg)   LMP 09/23/2017   SpO2 98%   BMI 37.15  kg/m    Review of Systems denies blurry vision, headache, chest pain, sob, n/v, muscle cramps, excessive diaphoresis, depression, cold intolerance, rhinorrhea, and easy bruising.  She has gained a few lbs.  She has urinary frequency.      Objective:   Physical Exam VS: see vs page GEN: no distress HEAD: head: no deformity eyes: no periorbital swelling, no proptosis external nose and ears are normal mouth: no lesion seen NECK: supple, thyroid is not enlarged CHEST WALL: no deformity LUNGS: clear to auscultation CV: reg rate and rhythm, no murmur ABD: abdomen is soft, nontender.  no hepatosplenomegaly.  not distended.  no hernia MUSCULOSKELETAL: muscle bulk and strength are grossly normal.  no obvious joint swelling.  gait is normal and  steady EXTEMITIES: no deformity.  no ulcer on the feet.  feet are of normal color and temp.  no edema PULSES: dorsalis pedis intact bilat.  no carotid bruit NEURO:  cn 2-12 grossly intact.   readily moves all 4's.  sensation is intact to touch on the feet SKIN:  Normal texture and temperature.  No rash or suspicious lesion is visible.   NODES:  None palpable at the neck PSYCH: alert, well-oriented.  Does not appear anxious nor depressed.    Lab Results  Component Value Date   HGBA1C 13.1 (H) 12/14/2017   I have reviewed outside records, and summarized:  Pt was noted to have severely elevated a1c, and referred here.  She was seen for prenatal care.  Besides DM, she was otherwise doing well.      Assessment & Plan:  Type 1 DM: severe exacerbation.  She needs a simpler regimen.  Pregnancy she needs prompt improvement in glycemic control.  Patient Instructions  good diet and exercise significantly improve the control of your diabetes.  please let me know if you wish to be referred to a dietician.  high blood sugar is very risky to your health.  you should see an eye doctor and dentist every year.  It is very important to get all recommended vaccinations.  Controlling your blood pressure and cholesterol drastically reduces the damage diabetes does to your body.  Those who smoke should quit.  Please discuss these with your doctor.  check your blood sugar 4 times a day: before the 3 meals, and at bedtime.  also check if you have symptoms of your blood sugar being too high or too low.  please keep a record of the readings and bring it to your next appointment here (or you can bring the meter itself).  You can write it on any piece of paper.  please call us sooner if your blood sugar goes below 70, or if you have a lot of readings over 200. For now, please: Change the lantus to 50 units each morning, and:  Take humalog, 2 units 3 times a day (just before each meal), no matter what your blood sugar  is.  Please come back for a follow-up appointment in 1 week.  Please see Bonita QuinLinda the same day.

## 2018-01-13 NOTE — Patient Instructions (Addendum)
good diet and exercise significantly improve the control of your diabetes.  please let me know if you wish to be referred to a dietician.  high blood sugar is very risky to your health.  you should see an eye doctor and dentist every year.  It is very important to get all recommended vaccinations.  Controlling your blood pressure and cholesterol drastically reduces the damage diabetes does to your body.  Those who smoke should quit.  Please discuss these with your doctor.  check your blood sugar 4 times a day: before the 3 meals, and at bedtime.  also check if you have symptoms of your blood sugar being too high or too low.  please keep a record of the readings and bring it to your next appointment here (or you can bring the meter itself).  You can write it on any piece of paper.  please call us sooner i[place]af your blood sugar goes below 70, or if you have a lot of readings over 200. For now, please: Change the lantus to 50 units each morning, and:  Take humalog, 2 units 3 times a day (just before each meal), no matter what your blood sugar is.  Please come back for a follow-up appointment in 1 week.  Please see Bonita QuinLinda the same day.

## 2018-01-18 ENCOUNTER — Ambulatory Visit: Payer: PRIVATE HEALTH INSURANCE | Admitting: Endocrinology

## 2018-01-18 ENCOUNTER — Telehealth: Payer: Self-pay | Admitting: General Practice

## 2018-01-18 ENCOUNTER — Encounter: Payer: PRIVATE HEALTH INSURANCE | Admitting: Nutrition

## 2018-01-18 DIAGNOSIS — Z0289 Encounter for other administrative examinations: Secondary | ICD-10-CM

## 2018-01-18 NOTE — Telephone Encounter (Signed)
Patient called and left message requesting results. 

## 2018-01-18 NOTE — Telephone Encounter (Signed)
Pt informed of results.

## 2018-02-02 ENCOUNTER — Encounter (HOSPITAL_COMMUNITY): Payer: Self-pay | Admitting: Obstetrics and Gynecology

## 2018-02-08 ENCOUNTER — Ambulatory Visit (INDEPENDENT_AMBULATORY_CARE_PROVIDER_SITE_OTHER): Payer: PRIVATE HEALTH INSURANCE | Admitting: Obstetrics & Gynecology

## 2018-02-08 VITALS — BP 118/80 | HR 88 | Wt 187.6 lb

## 2018-02-08 DIAGNOSIS — O24019 Pre-existing diabetes mellitus, type 1, in pregnancy, unspecified trimester: Secondary | ICD-10-CM

## 2018-02-08 DIAGNOSIS — O0990 Supervision of high risk pregnancy, unspecified, unspecified trimester: Secondary | ICD-10-CM

## 2018-02-08 NOTE — Progress Notes (Signed)
   PRENATAL VISIT NOTE  Subjective:  Pamela Horton is a 22 y.o. G1P0 at 8761w6d being seen today for ongoing prenatal care.  She is currently monitored for the following issues for this high-risk pregnancy and has Supervision of high risk pregnancy, antepartum; Supervision of high risk pregnancy in first trimester; Diabetes type 1, uncontrolled (HCC); and Pre-existing type 1 diabetes affecting pregnancy, antepartum on their problem list.  Patient reports BG uncontrolled.  Contractions: Not present. Vag. Bleeding: None.  Movement: Present. Denies leaking of fluid.   The following portions of the patient's history were reviewed and updated as appropriate: allergies, current medications, past family history, past medical history, past social history, past surgical history and problem list. Problem list updated.  Objective:   Vitals:   02/08/18 1112  BP: 118/80  Pulse: 88  Weight: 187 lb 9.6 oz (85.1 kg)    Fetal Status: Fetal Heart Rate (bpm): 165   Movement: Present     General:  Alert, oriented and cooperative. Patient is in no acute distress.  Skin: Skin is warm and dry. No rash noted.   Cardiovascular: Normal heart rate noted  Respiratory: Normal respiratory effort, no problems with respiration noted  Abdomen: Soft, gravid, appropriate for gestational age.  Pain/Pressure: Absent     Pelvic: Cervical exam deferred        Extremities: Normal range of motion.  Edema: None  Mental Status:  Normal mood and affect. Normal behavior. Normal judgment and thought content.   Assessment and Plan:  Pregnancy: G1P0 at 3361w6d  1. Supervision of high risk pregnancy, antepartum Had nl NIPS - AFP, Serum, Open Spina Bifida  2. Pre-existing type 1 diabetes affecting pregnancy, antepartum BG uncontrolled with values 80 to 500, 170 today, recently travelled to [place], will schedule f/u with Dr Everardo AllEllison and goal to obtain insulin pump  Preterm labor symptoms and general obstetric precautions including  but not limited to vaginal bleeding, contractions, leaking of fluid and fetal movement were reviewed in detail with the patient. Please refer to After Visit Summary for other counseling recommendations.  Return in about 2 weeks (around 02/22/2018).  MFM appt in 2 days Scheryl DarterJames Jagger Beahm, MD

## 2018-02-08 NOTE — Patient Instructions (Signed)
Second Trimester of Pregnancy The second trimester is from week 13 through week 28, month 4 through 6. This is often the time in pregnancy that you feel your best. Often times, morning sickness has lessened or quit. You may have more energy, and you may get hungry more often. Your unborn baby (fetus) is growing rapidly. At the end of the sixth month, he or she is about 9 inches long and weighs about 1 pounds. You will likely feel the baby move (quickening) between 18 and 20 weeks of pregnancy. Follow these instructions at home:  Avoid all smoking, herbs, and alcohol. Avoid drugs not approved by your doctor.  Do not use any tobacco products, including cigarettes, chewing tobacco, and electronic cigarettes. If you need help quitting, ask your doctor. You may get counseling or other support to help you quit.  Only take medicine as told by your doctor. Some medicines are safe and some are not during pregnancy.  Exercise only as told by your doctor. Stop exercising if you start having cramps.  Eat regular, healthy meals.  Wear a good support bra if your breasts are tender.  Do not use hot tubs, steam rooms, or saunas.  Wear your seat belt when driving.  Avoid raw meat, uncooked cheese, and liter boxes and soil used by cats.  Take your prenatal vitamins.  Take 1500-2000 milligrams of calcium daily starting at the 20th week of pregnancy until you deliver your baby.  Try taking medicine that helps you poop (stool softener) as needed, and if your doctor approves. Eat more fiber by eating fresh fruit, vegetables, and whole grains. Drink enough fluids to keep your pee (urine) clear or pale yellow.  Take warm water baths (sitz baths) to soothe pain or discomfort caused by hemorrhoids. Use hemorrhoid cream if your doctor approves.  If you have puffy, bulging veins (varicose veins), wear support hose. Raise (elevate) your feet for 15 minutes, 3-4 times a day. Limit salt in your diet.  Avoid heavy  lifting, wear low heals, and sit up straight.  Rest with your legs raised if you have leg cramps or low back pain.  Visit your dentist if you have not gone during your pregnancy. Use a soft toothbrush to brush your teeth. Be gentle when you floss.  You can have sex (intercourse) unless your doctor tells you not to.  Go to your doctor visits. Get help if:  You feel dizzy.  You have mild cramps or pressure in your lower belly (abdomen).  You have a nagging pain in your belly area.  You continue to feel sick to your stomach (nauseous), throw up (vomit), or have watery poop (diarrhea).  You have bad smelling fluid coming from your vagina.  You have pain with peeing (urination). Get help right away if:  You have a fever.  You are leaking fluid from your vagina.  You have spotting or bleeding from your vagina.  You have severe belly cramping or pain.  You lose or gain weight rapidly.  You have trouble catching your breath and have chest pain.  You notice sudden or extreme puffiness (swelling) of your face, hands, ankles, feet, or legs.  You have not felt the baby move in over an hour.  You have severe headaches that do not go away with medicine.  You have vision changes. This information is not intended to replace advice given to you by your health care provider. Make sure you discuss any questions you have with your health care   provider. Document Released: 02/10/2010 Document Revised: 04/23/2016 Document Reviewed: 01/17/2013 Elsevier Interactive Patient Education  2017 Elsevier Inc.  

## 2018-02-10 ENCOUNTER — Other Ambulatory Visit (HOSPITAL_COMMUNITY): Payer: Self-pay | Admitting: Obstetrics and Gynecology

## 2018-02-10 ENCOUNTER — Ambulatory Visit (HOSPITAL_COMMUNITY)
Admission: RE | Admit: 2018-02-10 | Discharge: 2018-02-10 | Disposition: A | Discharge status: Home / Self Care | Payer: PRIVATE HEALTH INSURANCE | Source: Ambulatory Visit | Attending: Obstetrics & Gynecology | Admitting: Obstetrics & Gynecology

## 2018-02-10 ENCOUNTER — Encounter (HOSPITAL_COMMUNITY): Payer: Self-pay

## 2018-02-10 DIAGNOSIS — O24312 Unspecified pre-existing diabetes mellitus in pregnancy, second trimester: Secondary | ICD-10-CM | POA: Diagnosis present

## 2018-02-10 DIAGNOSIS — O99212 Obesity complicating pregnancy, second trimester: Secondary | ICD-10-CM

## 2018-02-10 DIAGNOSIS — Z794 Long term (current) use of insulin: Principal | ICD-10-CM

## 2018-02-10 DIAGNOSIS — O358XX0 Maternal care for other (suspected) fetal abnormality and damage, not applicable or unspecified: Secondary | ICD-10-CM

## 2018-02-10 DIAGNOSIS — IMO0001 Reserved for inherently not codable concepts without codable children: Secondary | ICD-10-CM

## 2018-02-10 DIAGNOSIS — O359XX0 Maternal care for (suspected) fetal abnormality and damage, unspecified, not applicable or unspecified: Secondary | ICD-10-CM

## 2018-02-10 DIAGNOSIS — Z3689 Encounter for other specified antenatal screening: Secondary | ICD-10-CM | POA: Diagnosis not present

## 2018-02-10 DIAGNOSIS — Z3A18 18 weeks gestation of pregnancy: Secondary | ICD-10-CM | POA: Insufficient documentation

## 2018-02-10 LAB — AFP, SERUM, OPEN SPINA BIFIDA
AFP MOM: 1.18
AFP VALUE AFPOSL: 36.1 ng/mL
Gest. Age on Collection Date: 17.6 weeks
MATERNAL AGE AT EDD: 19.7 a
OSBR RISK 1 IN: 4129
Test Results:: NEGATIVE
WEIGHT: 187 [lb_av]

## 2018-02-14 ENCOUNTER — Telehealth (HOSPITAL_COMMUNITY): Payer: Self-pay | Admitting: MS"

## 2018-02-14 DIAGNOSIS — O358XX0 Maternal care for other (suspected) fetal abnormality and damage, not applicable or unspecified: Secondary | ICD-10-CM | POA: Insufficient documentation

## 2018-02-14 DIAGNOSIS — Z3A18 18 weeks gestation of pregnancy: Secondary | ICD-10-CM | POA: Insufficient documentation

## 2018-02-14 DIAGNOSIS — O359XX0 Maternal care for (suspected) fetal abnormality and damage, unspecified, not applicable or unspecified: Secondary | ICD-10-CM | POA: Insufficient documentation

## 2018-02-14 NOTE — Telephone Encounter (Signed)
Patient and patient's mom, Pamela Horton, called with additional questions. They inquired about pursuing a second opinion. Discussed that it certainly would make sense to pursue second opinion and briefly discussed options in the area, given that patient and her mom are originally from [place]. Discussed that [facility] or [facility] would likely be the nearest geographical options for second opinion. Discussed that it would likely be most thorough for primary OB to initiate this referral, given that they would be able to then forward all pertinent prenatal records, as our office would only be able to forward records [facility] us. Pamela Horton expressed that they are planning to visit OB office today to discuss these plans. Patient also inquired about receiving a copy of the diagnostic images. I discussed that I was unfamiliar with this process but would investigate further and then followup with patient and her mother. We reviewed that the 20 week limit in [place] for termination of pregnancy is specific to the state and that other states have limits later in gestation. All questions answered to their satisfaction at this time. She was encouraged to call back with additional questions or concerns.   Pamela BraunKaren Jaretzi Horton  02/14/2018 11:36 AM

## 2018-02-14 NOTE — Progress Notes (Signed)
Genetic Counseling  High-Risk Gestation Note  Appointment Date:  02/10/18 Referred By: Woodroe Mode, MD Date of Birth:  1996-08-24   Pregnancy History: G1P0 Estimated Date of Delivery: 07/13/18 Estimated Gestational Age: 13w1dAttending: MRenella Cunas MD   I met with [name] genetic counseling because of abnormal ultrasound findings. Her college roommate was with her at today's appointment.    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 Termination of pregnancy  Reviewed family history concerns  We began by reviewing the ultrasound in detail. Ultrasound today visualized multiple anomalies including: ventriculomegaly, absent CSP, hypoplasia of cerebellar vermis, and irregular contour of spine. 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 Limited views of fetal heart were obtained today. Complete ultrasound results reported separately.   Specifically, we discussed that congenital anomalies can occur as isolated, nonsyndromic birth defects, or as features of an underlying syndrome or association.  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 Ms. .BSalidohas  insulin dependent diabetes, and had a hemoglobin A1C of 13.1 on 12/14/17. We briefly reviewed that women who have insulin dependent diabetes are at an increased risk to have a baby with a birth defect.  The increase in risk correlates with the level of blood sugar control during the pregnancy, particularly during organogenesis.  The increase in risk is for any type of birth defect but is greatest for heart, limb, and neural tube defects.  The risk could be as high as 6-12% for individuals whose blood sugars are not well-controlled, but lower for women who have good blood sugar control throughout pregnancy. Congenital anomalies associated with diabetic embryopathy include cardiac malformations, central nervous system (neural tube defects, caudal regression, Dandy Walker malformation, holoprosencephaly), flexion contracture of limbs, vertebral anomalies, cleft palate, and intestinal anomalies.   We also reviewed the differential diagnoses for the ultrasound findings of an underlying chromosome or genetic cause. The risk for a genetic etiology increases with the presence of multiple fetal anatomic differences.  We reviewed chromosomes and genes. Ms. BOsmerpreviously had noninvasive prenatal screening (NIPS)/prenatal cell free DNA testing for fetal aneuploidy. We reviewed the results of this screen, [facility], was within normal limits for trisomy 21, trisomy 166 trisomy 148 monosomy X, triploidy, and 22q11.2 deletion. We reviewed methodology of this screen and the sensitivity and specificity.  In addition, we discussed the risk for other chromosome aberrations including microdeletions, duplications, insertions, and translocations.     Ms. BTaveswas then counseled regarding the availability of amniocentesis for chromosome analysis (karyotype and chromosomal microarray analysis) including the associated risks, benefits, and limitations. We discussed the associated 1 in [id number] for  complications from amniocentesis, including spontaneous pregnancy loss. We also discussed single gene conditions can cause multiple anomalies. They were counseled that single gene conditions are typically tested for postnatally, based on the recommendation of a  medical geneticist, unless ultrasound findings or the family history are strongly suggestive of a specific syndrome.  After careful consideration, Pamela Horton declined amniocentesis and declined additional prenatal genetic screens at this time.   Mrs. * was counseled that the prognosis and postnatal management depend somewhat on the underlying etiology of the fetal differences. We spent time discussing the ultrasound findings of a Secondary school teacher ([name]). She was counseled that [name] is a congenital brain malformation characterized by enlargement of the fourth ventricle, absence of the cerebellar vermis, and the formation of a cyst near the base of the skull. 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  Neurodevelopmental outcome with [name] and DWV varies, with reports of normal development to severe intellectual dysfunction being described.  We discussed additional pediatric specialists who would be available to assist in postnatal medical care including neurosurgery and orthopedics.   We then discussed the option of serial sonography and fetal echocardiogram.  She understands that an accurate discussion of prognosis is challenging at this time. She understands that ultrasound cannot rule out all birth defects or genetic syndromes.  We also discussed the option of continuing the pregnancy versus termination of pregnancy.  We discussed that pregnancy termination is a legal option in [place] until [redacted] weeks gestation.  We discussed the options of D&E and induction.  Pamela Horton was undecided regarding how she would like to proceed with the pregnancy at this time. We planned for a follow-up  phone call next week to further discuss and determine follow-up for the pregnancy.    Both family histories were reviewed and found to be noncontributory for birth defects, intellectual disability, and known genetic conditions. Consanguinity was denied. Without further information regarding the provided family history, an accurate genetic risk cannot be calculated. Further genetic counseling is warranted if more information is obtained.  I counseled Ms. Shirlene Andaya regarding the above risks and available options.  The approximate face-to-face time with the genetic counselor was 40 minutes.  Chipper Oman, MS Certified Genetic Counselor 02/14/2018

## 2018-02-15 ENCOUNTER — Ambulatory Visit (INDEPENDENT_AMBULATORY_CARE_PROVIDER_SITE_OTHER): Payer: PRIVATE HEALTH INSURANCE | Admitting: Obstetrics and Gynecology

## 2018-02-15 ENCOUNTER — Telehealth (HOSPITAL_COMMUNITY): Payer: Self-pay | Admitting: MS"

## 2018-02-15 ENCOUNTER — Encounter: Payer: Self-pay | Admitting: Obstetrics and Gynecology

## 2018-02-15 VITALS — BP 133/84 | HR 98 | Wt 188.9 lb

## 2018-02-15 DIAGNOSIS — O359XX0 Maternal care for (suspected) fetal abnormality and damage, unspecified, not applicable or unspecified: Secondary | ICD-10-CM

## 2018-02-15 DIAGNOSIS — O24019 Pre-existing diabetes mellitus, type 1, in pregnancy, unspecified trimester: Secondary | ICD-10-CM

## 2018-02-15 DIAGNOSIS — O24012 Pre-existing diabetes mellitus, type 1, in pregnancy, second trimester: Secondary | ICD-10-CM | POA: Diagnosis not present

## 2018-02-15 DIAGNOSIS — O0992 Supervision of high risk pregnancy, unspecified, second trimester: Secondary | ICD-10-CM

## 2018-02-15 DIAGNOSIS — O0990 Supervision of high risk pregnancy, unspecified, unspecified trimester: Secondary | ICD-10-CM

## 2018-02-15 DIAGNOSIS — O358XX0 Maternal care for other (suspected) fetal abnormality and damage, not applicable or unspecified: Secondary | ICD-10-CM

## 2018-02-15 NOTE — Progress Notes (Signed)
PRENATAL VISIT NOTE  Subjective:  Pamela Horton is a 22 y.o. G1P0 at 1946w6d being seen today for ongoing prenatal care.  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  Patient reports no complaints.  Contractions: Not present. Vag. Bleeding: None.  Movement: Present. Denies leaking of fluid.   T1DM on lantus and humalog FG: 60-80 PP: during day, after meals 110-120, after dinner 140-150s  lantus 50 units in afternoon Is increasing insulin for higher sugars, trying to keep below 120  The following portions of the patient's history were reviewed and updated as appropriate: allergies, current medications, past family history, past medical history, past social history, past surgical history and problem list. Problem list updated.  Objective:   Vitals:   02/15/18 1513  BP: 133/84  Pulse: 98  Weight: 188 lb 14.4 oz (85.7 kg)    Fetal Status: Fetal Heart Rate (bpm): 160   Movement: Present     General:  Alert, oriented and cooperative. Patient is in no acute distress.  Skin: Skin is warm and dry. No rash noted.   Cardiovascular: Normal heart rate noted  Respiratory: Normal respiratory effort, no problems with respiration noted  Abdomen: Soft, gravid, appropriate for gestational age.  Pain/Pressure: Absent     Pelvic: Cervical exam deferred        Extremities: Normal range of motion.  Edema: None  Mental Status:  Normal mood and affect. Normal behavior. Normal judgment and thought content.   Assessment and Plan:  Pregnancy: G1P0 at 6646w6d  1. Supervision of high risk pregnancy, antepartum  2. Pre-existing type 1 diabetes  affecting pregnancy, antepartum On insulin Log not available today (rebooting sensor) Improved CBGs over last two weeks, states she has been doing a much better job of keeping them under control  3. Pregnancy affected by multiple congenital anomalies of fetus, single or unspecified fetus Had long discussion with patient, patient's mother and father regarding anomalies noted on ultrasound. Reviewed normal NIPS and AFP. Reviewed anomalies noted and patient appears to have good understanding that fetus has multiple anomalies of brain, spine, limbs. Reviewed that it is difficult to assess fetal survival with isolated anomalies, reviewed that I cannot give them a percentage of survival however based on multiple brain anomalies, normal neuro development is unlikely. They verbalize understanding that it is difficult to say exactly how fetus/neonate would be affected. Per records, they had previously declined amniocentesis however patient states she simply wanted time to consider. They are interested in pursuing amniocentesis at this time. [facility]. I reviewed again the options of termination of pregnancy, including that termination is legal in [place] until 20 weeks, and it remains legal for a few more weeks in other states. They are still considering termination but would like to obtain more information at the moment. I reviewed induction versus D&E as well and that they would be referred to another facility if she desires D&E. Answered all questions, they verbalize understanding of plan and were grateful for discussion. Amniocentesis scheduled for 02/16/18 with MFM.   Preterm labor symptoms and general obstetric precautions including but not limited to vaginal bleeding, contractions, leaking of  fluid and fetal movement were reviewed in detail with the patient. Please refer to After Visit Summary for other counseling  recommendations.  Return in about 2 weeks (around 03/01/2018) for OB visit (MD).   Conan Bowens, MD

## 2018-02-15 NOTE — Telephone Encounter (Signed)
[facility] staff at our office contacted patient and informed her that appointment for second opinion is scheduled with [facility] on 02/22/18 at 9:00 am. Patient expressed understanding.

## 2018-02-16 ENCOUNTER — Ambulatory Visit (HOSPITAL_COMMUNITY)
Admission: RE | Admit: 2018-02-16 | Discharge: 2018-02-16 | Disposition: A | Discharge status: Home / Self Care | Payer: PRIVATE HEALTH INSURANCE | Source: Ambulatory Visit | Attending: Obstetrics and Gynecology | Admitting: Obstetrics and Gynecology

## 2018-02-16 ENCOUNTER — Encounter (HOSPITAL_COMMUNITY): Payer: Self-pay

## 2018-02-16 ENCOUNTER — Other Ambulatory Visit: Payer: Self-pay | Admitting: Obstetrics and Gynecology

## 2018-02-16 DIAGNOSIS — O359XX0 Maternal care for (suspected) fetal abnormality and damage, unspecified, not applicable or unspecified: Secondary | ICD-10-CM | POA: Insufficient documentation

## 2018-02-16 DIAGNOSIS — Z363 Encounter for antenatal screening for malformations: Secondary | ICD-10-CM | POA: Insufficient documentation

## 2018-02-16 DIAGNOSIS — Z3A19 19 weeks gestation of pregnancy: Secondary | ICD-10-CM | POA: Insufficient documentation

## 2018-02-16 DIAGNOSIS — O358XX0 Maternal care for other (suspected) fetal abnormality and damage, not applicable or unspecified: Secondary | ICD-10-CM

## 2018-02-16 LAB — ROUTINE CHROMOSOME - KARYOTYPE

## 2018-02-16 NOTE — Addendum Note (Signed)
Encounter addended by: Drue NovelKeatts, Keora Eccleston J, RDMS on: 02/16/2018 12:41 PM  Actions taken: Imaging Exam ended

## 2018-02-22 ENCOUNTER — Encounter: Payer: PRIVATE HEALTH INSURANCE | Admitting: Obstetrics & Gynecology

## 2018-02-23 ENCOUNTER — Ambulatory Visit: Payer: PRIVATE HEALTH INSURANCE | Admitting: Endocrinology

## 2018-02-23 DIAGNOSIS — Z0289 Encounter for other administrative examinations: Secondary | ICD-10-CM

## 2018-03-01 ENCOUNTER — Telehealth: Payer: Self-pay | Admitting: *Deleted

## 2018-03-01 ENCOUNTER — Encounter: Payer: PRIVATE HEALTH INSURANCE | Admitting: Obstetrics and Gynecology

## 2018-03-01 NOTE — Telephone Encounter (Signed)
Pt informed Sue Lushndrea from Social work that the appt needed to be called and to remove her from list..

## 2018-03-03 ENCOUNTER — Telehealth (HOSPITAL_COMMUNITY): Payer: Self-pay | Admitting: MS"

## 2018-03-03 NOTE — Telephone Encounter (Signed)
Called Pamela Horton to discuss the karyotype results from her amniocentesis. We reviewed that these are within normal limits.  [place] in [place]. We discussed that if she has additional need for support resources, such as support group or individual counseling when she is in the [place] area, to reach out to us so that we can provide information regarding those resources.  All questions were answered to her satisfaction, she was encouraged to call with additional questions or concerns.  Quinn PlowmanKaren Taylen Osorto MS Patent attorneyCertified Genetic Counselor

## 2018-03-07 ENCOUNTER — Other Ambulatory Visit (HOSPITAL_COMMUNITY): Payer: Self-pay

## 2018-03-10 ENCOUNTER — Other Ambulatory Visit: Payer: Self-pay | Admitting: Certified Nurse Midwife

## 2018-03-28 ENCOUNTER — Telehealth (HOSPITAL_COMMUNITY): Payer: Self-pay | Admitting: MS"

## 2018-03-28 ENCOUNTER — Encounter (HOSPITAL_COMMUNITY): Payer: Self-pay | Admitting: MS"

## 2018-03-28 NOTE — Telephone Encounter (Signed)
Called Ms. Pamela Horton regarding chromosomal microarray result from amniocentesis. Patient identified by name and DOB. Discussed that microarray results are within normal limits. Reviewed the limitations of microarray. Patient understands this did not assess for all single gene conditions. Patient stated that she is still back home and is surrounded by a good support system following the pregnancy loss. Encouraged her to reach out if we can assist with providing additional support resources.   [name] 03/28/2018 10:42 AM

## 2018-03-29 ENCOUNTER — Other Ambulatory Visit (HOSPITAL_COMMUNITY): Payer: Self-pay

## 2019-08-05 IMAGING — US US OB COMP LESS 14 WK
1 series · 15 of 28 positions shown · non-contrast
Comparison: None.

CLINICAL DATA: High-risk pregnancy. Maternal type 1 diabetes.
Unsure of LMP.

EXAM:
OBSTETRIC <14 WK ULTRASOUND
TECHNIQUE: Transabdominal ultrasound was performed for evaluation of the
gestation as well as the maternal uterus and adnexal regions.

[Series 1: us ob comp less 14 wk · 15 of 43 slices shown]
[im 1/43]
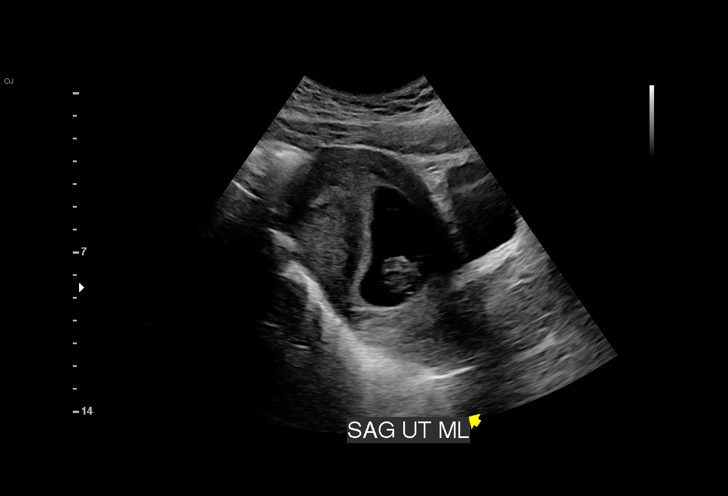
[im 4/43]
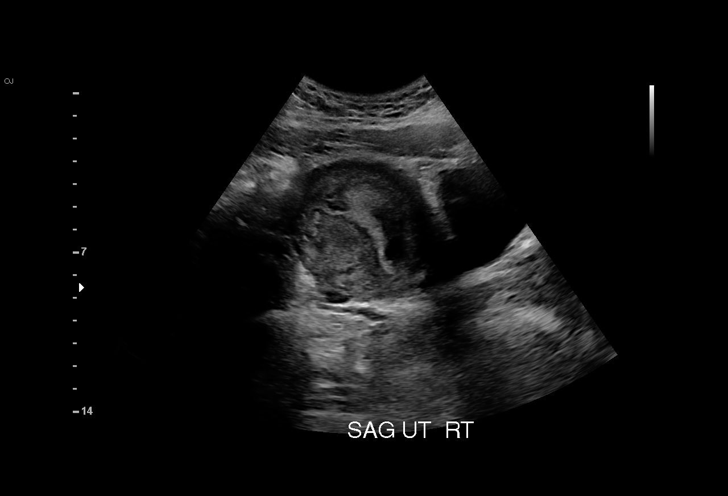
[im 7/43]
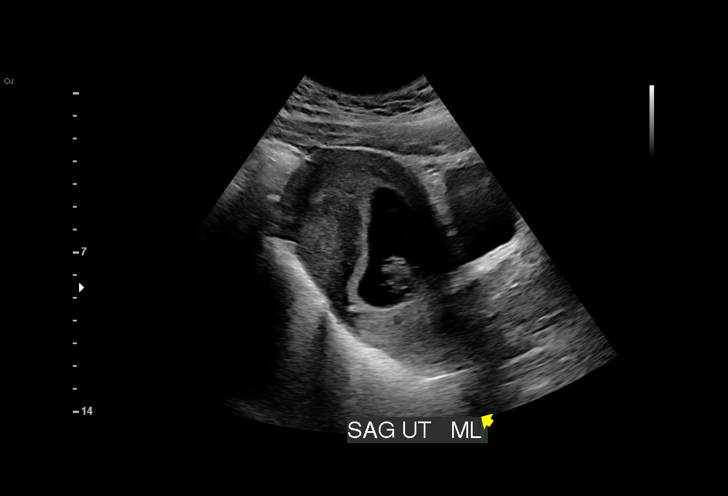
[im 10/43]
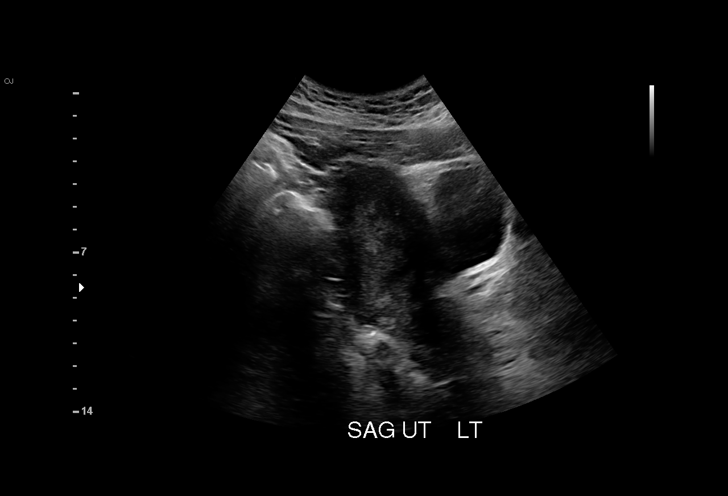
[im 13/43]
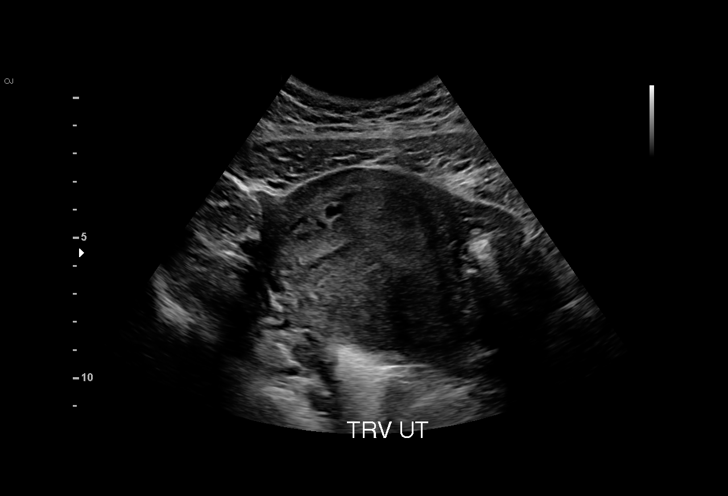
[im 16/43]
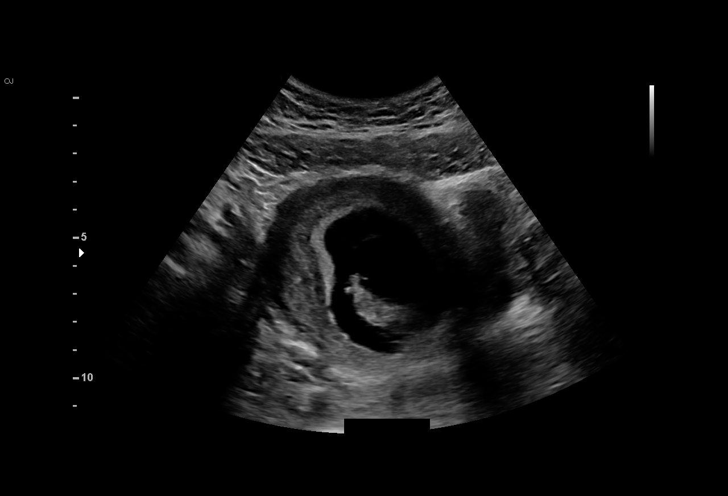
[im 19/43]
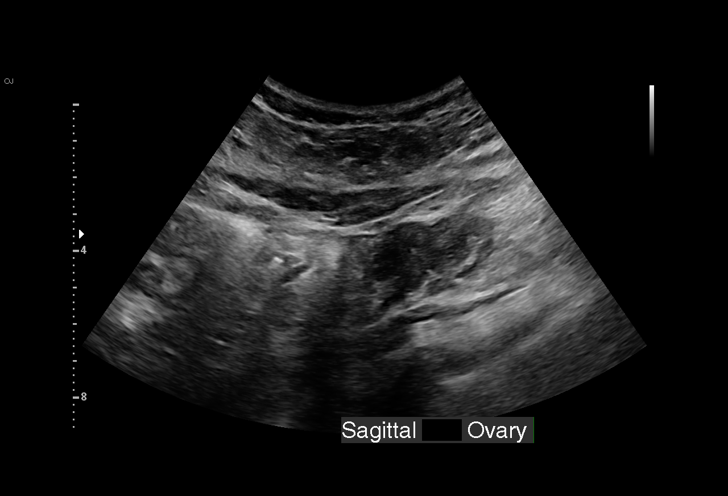
[im 22/43]
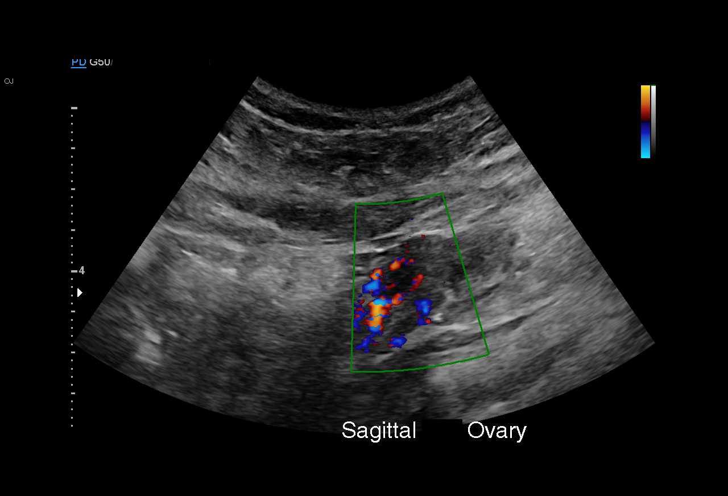
[im 24/43]
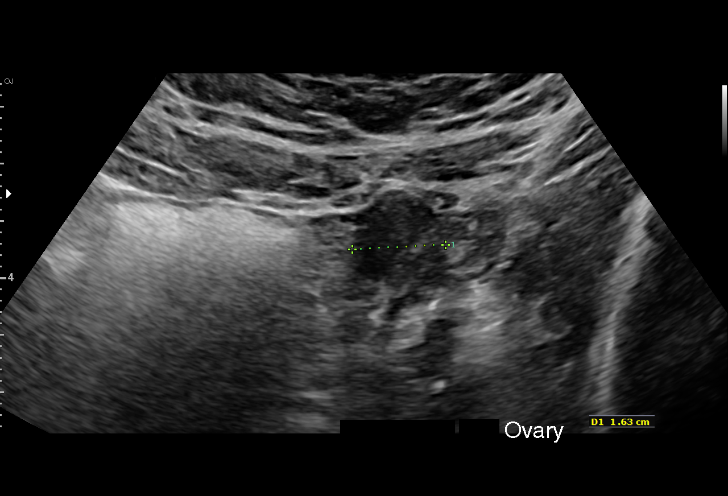
[im 27/43]
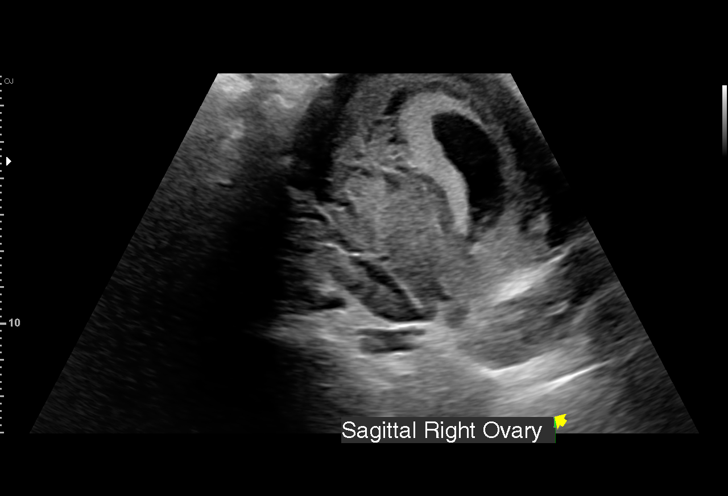
[im 30/43]
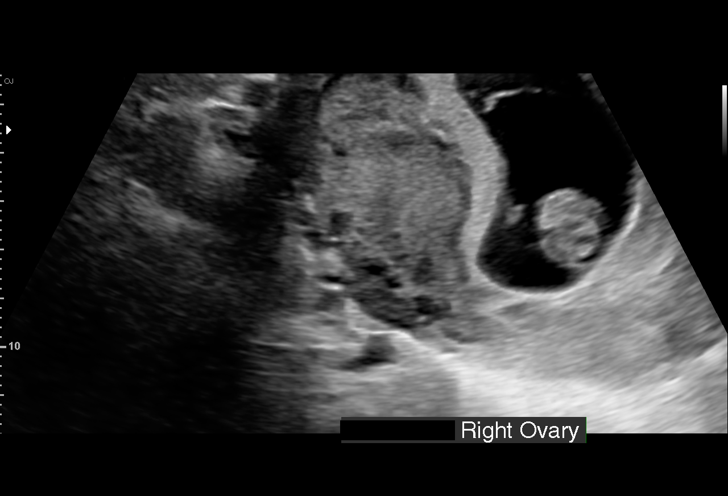
[im 33/43]
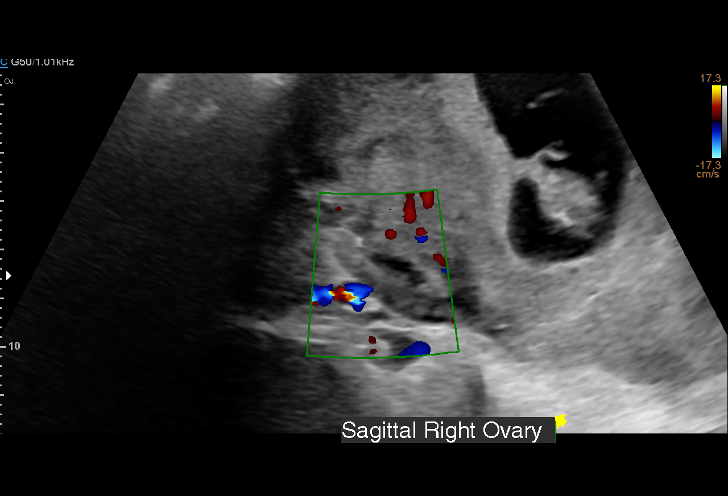
[im 36/43]
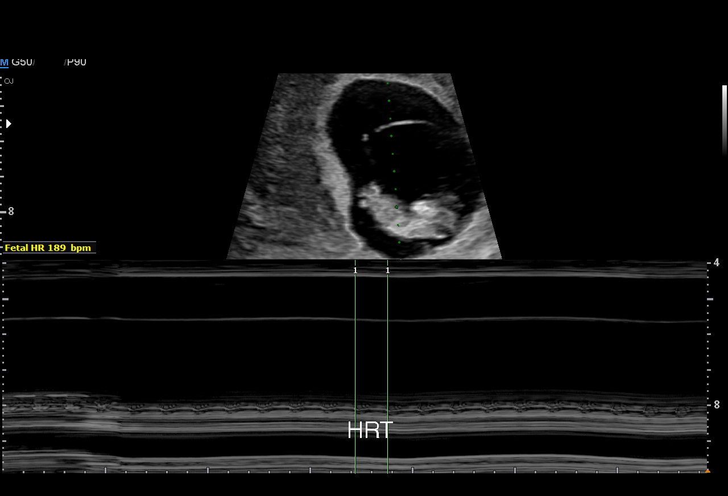
[im 39/43]
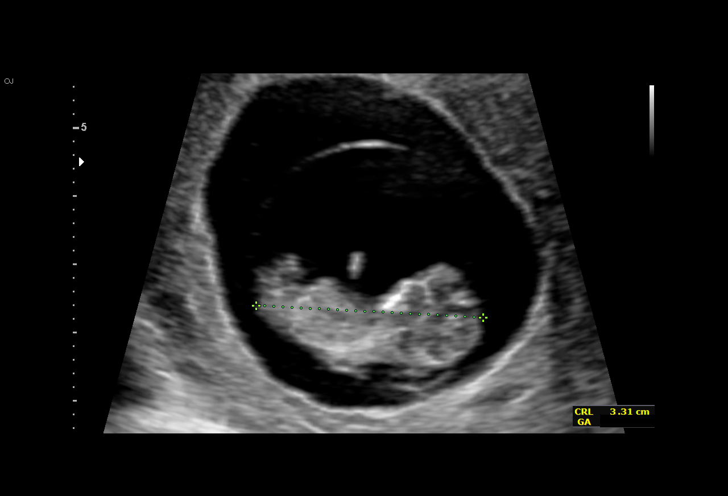
[im 43/43]
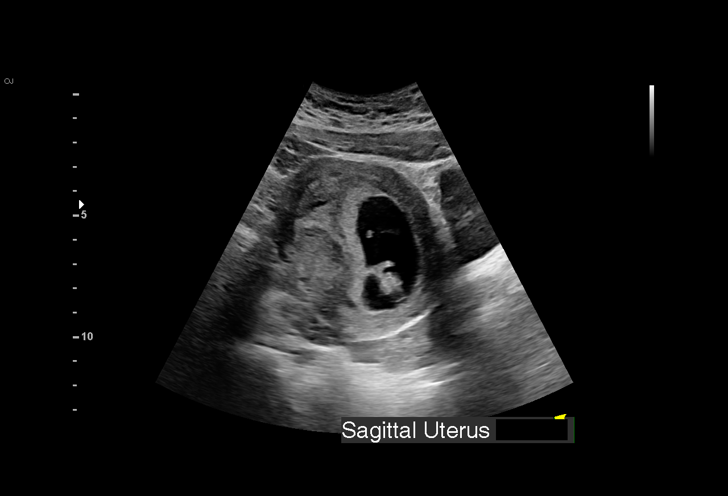

[15 of 28 positions shown; findings below may reference images not displayed]

FINDINGS: Intrauterine gestational sac: Single

Yolk sac:  Visualized.

Embryo:  Visualized.

Cardiac Activity: Visualized.

Heart Rate: 189 bpm

CRL:   33 mm   10 w 1 d                  US EDC: 07/13/2018

Subchorionic hemorrhage:  None visualized.

Maternal uterus/adnexae: Normal appearance of both ovaries. No mass
or free fluid identified.
IMPRESSION: Single living IUP measuring 10 weeks 1 day, with US EDC of
07/13/2018.

No significant maternal uterine or adnexal abnormality identified.
